# Patient Record
Sex: Male | Born: 1965 | Hispanic: No | Marital: Single | Smoking: Never smoker
Health system: Southern US, Community
[De-identification: ages and names within clinical notes are randomized; demographics above are authoritative.]

---

## 1999-02-18 ENCOUNTER — Emergency Department (HOSPITAL_COMMUNITY): Admission: EM | Admit: 1999-02-18 | Discharge: 1999-02-18 | Payer: Self-pay | Admitting: *Deleted

## 1999-02-18 ENCOUNTER — Encounter: Payer: Self-pay | Admitting: Emergency Medicine

## 2019-08-11 ENCOUNTER — Emergency Department (HOSPITAL_COMMUNITY): Payer: Self-pay

## 2019-08-11 ENCOUNTER — Other Ambulatory Visit: Payer: Self-pay

## 2019-08-11 ENCOUNTER — Encounter (HOSPITAL_COMMUNITY): Payer: Self-pay

## 2019-08-11 ENCOUNTER — Emergency Department (HOSPITAL_COMMUNITY)
Admission: EM | Admit: 2019-08-11 | Discharge: 2019-08-12 | Disposition: A | Discharge status: Home / Self Care | Payer: Self-pay | Attending: Emergency Medicine | Admitting: Emergency Medicine

## 2019-08-11 DIAGNOSIS — Z5321 Procedure and treatment not carried out due to patient leaving prior to being seen by health care provider: Secondary | ICD-10-CM | POA: Insufficient documentation

## 2019-08-11 DIAGNOSIS — R0789 Other chest pain: Secondary | ICD-10-CM | POA: Insufficient documentation

## 2019-08-11 LAB — TROPONIN I (HIGH SENSITIVITY)
Troponin I (High Sensitivity): 4 ng/L (ref <18)
Troponin I (High Sensitivity): 5 ng/L (ref <18)

## 2019-08-11 LAB — BASIC METABOLIC PANEL
Anion gap: 12 (ref 5–15)
BUN: 16 mg/dL (ref 6–20)
CO2: 24 mmol/L (ref 22–32)
Calcium: 9 mg/dL (ref 8.9–10.3)
Chloride: 104 mmol/L (ref 98–111)
Creatinine, Ser: 0.85 mg/dL (ref 0.61–1.24)
GFR calc Af Amer: >60 mL/min (ref >60)
GFR calc non Af Amer: >60 mL/min (ref >60)
Glucose, Bld: 103 mg/dL — ABNORMAL HIGH (ref 70–99)
Potassium: 4 mmol/L (ref 3.5–5.1)
Sodium: 140 mmol/L (ref 135–145)

## 2019-08-11 LAB — CBC
HCT: 46.8 % (ref 39.0–52.0)
Hemoglobin: 15.6 g/dL (ref 13.0–17.0)
MCH: 29.5 pg (ref 26.0–34.0)
MCHC: 33.3 g/dL (ref 30.0–36.0)
MCV: 88.5 fL (ref 80.0–100.0)
Platelets: 209 10*3/uL (ref 150–400)
RBC: 5.29 MIL/uL (ref 4.22–5.81)
RDW: 13.2 % (ref 11.5–15.5)
WBC: 9.4 10*3/uL (ref 4.0–10.5)
nRBC: 0 % (ref 0.0–0.2)

## 2019-08-11 MED ORDER — SODIUM CHLORIDE 0.9% FLUSH: 3.0000 mL | Freq: Once | INTRAVENOUS | Status: DC

## 2019-08-11 NOTE — ED Triage Notes (Signed)
Pt arrives to ED w/ c/o chest pain. Pt denies sob, n/v. Pt reports 5/10 pain. Central located, non-radiating. Pt took 3 nitro w/ minimal relief. Pt denies cardiac hx.

## 2019-08-12 ENCOUNTER — Encounter (HOSPITAL_COMMUNITY): Payer: Self-pay

## 2019-08-12 ENCOUNTER — Other Ambulatory Visit: Payer: Self-pay

## 2019-08-12 ENCOUNTER — Emergency Department (HOSPITAL_COMMUNITY): Payer: HRSA Program

## 2019-08-12 ENCOUNTER — Observation Stay (HOSPITAL_COMMUNITY)
Admission: EM | Admit: 2019-08-12 | Discharge: 2019-08-12 | Discharge status: Against Medical Advice | Payer: HRSA Program | Attending: Internal Medicine | Admitting: Internal Medicine

## 2019-08-12 DIAGNOSIS — E876 Hypokalemia: Secondary | ICD-10-CM | POA: Insufficient documentation

## 2019-08-12 DIAGNOSIS — R0789 Other chest pain: Secondary | ICD-10-CM | POA: Diagnosis not present

## 2019-08-12 DIAGNOSIS — U071 COVID-19: Secondary | ICD-10-CM | POA: Diagnosis not present

## 2019-08-12 DIAGNOSIS — R079 Chest pain, unspecified: Secondary | ICD-10-CM | POA: Insufficient documentation

## 2019-08-12 DIAGNOSIS — R002 Palpitations: Secondary | ICD-10-CM | POA: Diagnosis present

## 2019-08-12 LAB — CBC
HCT: 48.1 % (ref 39.0–52.0)
Hemoglobin: 15.8 g/dL (ref 13.0–17.0)
MCH: 29 pg (ref 26.0–34.0)
MCHC: 32.8 g/dL (ref 30.0–36.0)
MCV: 88.3 fL (ref 80.0–100.0)
Platelets: 189 10*3/uL (ref 150–400)
RBC: 5.45 MIL/uL (ref 4.22–5.81)
RDW: 13.2 % (ref 11.5–15.5)
WBC: 7.2 10*3/uL (ref 4.0–10.5)
nRBC: 0 % (ref 0.0–0.2)

## 2019-08-12 LAB — TROPONIN I (HIGH SENSITIVITY)
Troponin I (High Sensitivity): 2 ng/L (ref <18)
Troponin I (High Sensitivity): 3 ng/L (ref <18)

## 2019-08-12 LAB — BASIC METABOLIC PANEL
Anion gap: 9 (ref 5–15)
BUN: 16 mg/dL (ref 6–20)
CO2: 24 mmol/L (ref 22–32)
Calcium: 8.7 mg/dL — ABNORMAL LOW (ref 8.9–10.3)
Chloride: 103 mmol/L (ref 98–111)
Creatinine, Ser: 0.77 mg/dL (ref 0.61–1.24)
GFR calc Af Amer: >60 mL/min (ref >60)
GFR calc non Af Amer: >60 mL/min (ref >60)
Glucose, Bld: 181 mg/dL — ABNORMAL HIGH (ref 70–99)
Potassium: 3.7 mmol/L (ref 3.5–5.1)
Sodium: 136 mmol/L (ref 135–145)

## 2019-08-12 LAB — SARS CORONAVIRUS 2 BY RT PCR (HOSPITAL ORDER, PERFORMED IN ~~LOC~~ HOSPITAL LAB): SARS Coronavirus 2: POSITIVE — AB

## 2019-08-12 MED ORDER — SODIUM CHLORIDE 0.9% FLUSH: 3.0000 mL | Freq: Once | INTRAVENOUS | Status: DC

## 2019-08-12 NOTE — ED Triage Notes (Addendum)
Pt presents with c/o chest pain that started 4 days ago. Pt also reporting some pain in his head that started 4 days ago as well. Pt reports he has some nitro but that has not been helping the pain. Pt went to [facility] for same yesterday but the wait was too long so he left before seeing a doctor.

## 2019-08-12 NOTE — Progress Notes (Signed)
Progress note  Notified by bedside RN that patient is requesting to leave AMA. Asked RN to enter room with electronic translator to verify understanding of decision to leave AMA while this provider was on the phone. Patient stated he understood he was being admitted under observation due to complaints of chest pain for close observation overnight and stated he still wanted to leave as he no 'felt fine". I explained the risk of leaving AMA including but not limited to the risk of death and patient continued to express the desire to leave AMA. RN was at bedside during call and aware of patients decision.   Delfin Gant, NP-C Contact / Pager information can be found on Amion  08/12/2019, 10:45 PM

## 2019-08-12 NOTE — Progress Notes (Signed)
Progress note  Notified by ED charge nurse that patients COVID screening on admission resulted positive. Patient is currently asymptomatic. Closely monitor, COVID order set placed.   Delfin Gant, NP-C Contact / Pager information can be found on Amion  08/12/2019, 7:18 PM

## 2019-08-12 NOTE — H&P (Addendum)
History and Physical    Jafet Wissing ZOX:[id number] DOB: 11/29/1965 DOA: 08/12/2019  PCP: Patient, No Pcp Per   Patient coming from:  Home   Chief Complaint: Palpitations   HPI: Banjamin Stovall is a 54 y.o. male with no significant past medical history who presents with palpitations.  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  He denies any PND, orthopnea or palpitations.  He is able to do his physical work without any impairments.  Denies any tobacco, alcohol or drug abuse.  Due to his persistent symptoms he was seen at a local urgent clinic, he had nitroglycerin prescribed.   ED Course: Initial testing for acute coronary syndrome have been negative.  Patient has been referred for further cardiac work-up.  Review of Systems:  1. General: No fevers, no chills, no weight gain or weight loss 2. ENT: No runny nose or sore throat, no hearing disturbances 3. Pulmonary: No dyspnea, cough, wheezing, or hemoptysis 4. Cardiovascular: No angina, claudication, lower extremity edema, pnd or orthopnea/ positive for palpitations as mentioned in HPI.  5. Gastrointestinal: No nausea or vomiting, no diarrhea or constipation 6. Hematology: No easy bruisability or frequent infections 7. Urology: No dysuria, hematuria or increased urinary frequency 8. Dermatology: No rashes. 9. Neurology: No seizures or paresthesias 10. Musculoskeletal: No joint pain or deformities  History reviewed. No pertinent past medical history.  History reviewed. No pertinent surgical history.   reports that he has never smoked. He has never used smokeless tobacco. He reports that he does not drink alcohol or use drugs.  No Known Allergies  History reviewed. No pertinent family history.  Negative family  history for heart disease, diabetes or cancer.   Prior to Admission medications   Not on File    Physical Exam: Vitals:   08/12/19 1047 08/12/19 1421  BP: (!) 142/78 136/85  Pulse: 63 (!) 57  Resp: 18 16  Temp: 98.3 F (36.8 C) 98.2 F (36.8 C)  TempSrc: Oral Oral  SpO2: 94% 97%    Vitals:   08/12/19 1047 08/12/19 1421  BP: (!) 142/78 136/85  Pulse: 63 (!) 57  Resp: 18 16  Temp: 98.3 F (36.8 C) 98.2 F (36.8 C)  TempSrc: Oral Oral  SpO2: 94% 97%   General: Not in pain or dyspnea  Neurology: Awake and alert, non focal Head and Neck. Head normocephalic. Neck supple with no adenopathy or thyromegaly.   E ENT: no pallor, no icterus, oral mucosa moist Cardiovascular: No JVD. S1-S2 present, rhythmic, no gallops, rubs, or murmurs. No lower extremity edema. Pulmonary: positive breath sounds bilaterally, adequate air movement, no wheezing, rhonchi or rales. Gastrointestinal. Abdomen with  no organomegaly, non tender, no rebound or guarding Skin. No rashes Musculoskeletal: no joint deformities    Labs on Admission: I have personally reviewed following labs and imaging studies  CBC: Recent Labs  Lab 08/11/19 1938 08/12/19 1148  WBC 9.4 7.2  HGB 15.6 15.8  HCT 46.8 48.1  MCV 88.5 88.3  PLT 209 189   Basic Metabolic Panel: Recent Labs  Lab 08/11/19 1938 08/12/19 1148  NA 140 136  K 4.0 3.7  CL 104 103  CO2 24 24  GLUCOSE 103* 181*  BUN 16 16  CREATININE 0.85 0.77  CALCIUM 9.0 8.7*   GFR: CrCl cannot be calculated (Unknown ideal weight.). Liver Function Tests:  No results for input(s): AST, ALT, ALKPHOS, BILITOT, PROT, ALBUMIN in the last 168 hours. No results for input(s): LIPASE, AMYLASE in the last 168 hours. No results for input(s): AMMONIA in the last 168 hours. Coagulation Profile: No results for input(s): INR, PROTIME in the last 168 hours. Cardiac Enzymes: No results for input(s): CKTOTAL, CKMB, CKMBINDEX, TROPONINI in the last 168 hours. BNP  (last 3 results) No results for input(s): PROBNP in the last 8760 hours. HbA1C: No results for input(s): HGBA1C in the last 72 hours. CBG: No results for input(s): GLUCAP in the last 168 hours. Lipid Profile: No results for input(s): CHOL, HDL, LDLCALC, TRIG, CHOLHDL, LDLDIRECT in the last 72 hours. Thyroid Function Tests: No results for input(s): TSH, T4TOTAL, FREET4, T3FREE, THYROIDAB in the last 72 hours. Anemia Panel: No results for input(s): VITAMINB12, FOLATE, FERRITIN, TIBC, IRON, RETICCTPCT in the last 72 hours. Urine analysis: No results found for: COLORURINE, APPEARANCEUR, LABSPEC, PHURINE, GLUCOSEU, HGBUR, BILIRUBINUR, KETONESUR, PROTEINUR, UROBILINOGEN, NITRITE, LEUKOCYTESUR  Radiological Exams on Admission: DG Chest 2 View  Result Date: 08/12/2019 CLINICAL DATA:  Chest pain starting 4 days ago. EXAM: CHEST - 2 VIEW COMPARISON:  Chest x-ray dated 08/11/2019. FINDINGS: Heart size and mediastinal contours are within normal limits. Lungs are clear. No pleural effusion or pneumothorax is seen. Osseous structures about the chest are unremarkable. IMPRESSION: No active cardiopulmonary disease. No evidence of pneumonia or pulmonary edema. Electronically Signed   By: Bary Richard M.D.   On: 08/12/2019 11:17   DG Chest 2 View  Result Date: 08/11/2019 CLINICAL DATA:  Chest pain EXAM: CHEST - 2 VIEW COMPARISON:  None. FINDINGS: The heart size and mediastinal contours are within normal limits. Both lungs are clear. The visualized skeletal structures are unremarkable. IMPRESSION: No active cardiopulmonary disease. Electronically Signed   By: Jonna Clark M.D.   On: 08/11/2019 20:08    EKG: Independently reviewed.  60 bpm, normal axis, normal intervals, sinus rhythm, small q in lead III and aVF, no ST segment or T wave changes.  Assessment/Plan Active Problems:   Palpitations   54 year old male with no significant past medical history who presents with palpitations for the last 4 days.   His symptoms seem not to be exertion related, no signs of heart failure or angina.  On his initial physical examination his temperature is 98.2, blood pressure 136/85, heart rate 57-63, respiratory rate 16, oxygen saturation 97% on room air, his lungs are clear to auscultation bilaterally, heart S1-S2 present rhythmic, soft abdomen, no lower extremity edema. Sodium 136, potassium 3.7, chloride 103, bicarb 24, glucose 181, BUN 16, creatinine 0.77, troponin I 2, white count 7.2, hemoglobin 15.8, hematocrit 48.2, platelets 189.  Chest radiograph with no infiltrates, prominent right pulmonary artery.  Patient will be admitted to the hospital working diagnosis of palpitations, rule out cardiac arrhythmia.  1.  Palpitations. Patient will be admitted to the telemetry ward, at this point he has ruled out for acute coronary syndrome.  If no further arrhythmias on the monitor patient will be candidate for outpatient cardiac monitoring.  Further work-up with echocardiography to rule out any structural heart disease.  2.  Hypokalemia.  Will give 40 mEq of potassium chloride, check magnesium in a.m.  Target K at 4 and magnesium 2.   Status is: Observation  The patient remains OBS appropriate and will d/c before 2 midnights.  Dispo: The patient is from: Home              Anticipated d/c is  to: Home              Anticipated d/c date is: 1 day              Patient currently is not medically stable to d/c.   DVT prophylaxis: Enoxaparin   Code Status:   full  Family Communication:  No family at the bedside     Consults called:  None   Admission status:  Observation.    Georgios Kina Gerome Apley MD [facility]   08/12/2019, 4:47 PM

## 2019-08-12 NOTE — ED Provider Notes (Signed)
[facility] DEPT Provider Note   CSN: [id number] Arrival date & time: 08/12/19  1038     History Chief Complaint  Patient presents with  . Chest Pain  . Headache    Jeffrey Lamb is a 54 y.o. male.  HPI HPI Comments: Jeffrey Lamb is a 54 y.o. male with no known medical history who presents to the Emergency Department complaining of chest pain.  Patient states about 4 days ago he started experiencing spontaneous chest pain that lasts about 2 to 3 minutes with each episode.  During these episodes he notes associated palpitations, mild headache, mild abdominal pain.  These occur about 2-3 times a day.  No known modifying factors.  He was initially taking Tylenol which was providing mild relief.  [place] [facility] [facility]" yesterday and was given nitroglycerin.  He states he took this 1 time yesterday with relief.  His most recent occurrence was this morning.  He notes some palpable pain along the left chest wall.  He does not smoke, drink alcohol, or do drugs.  He denies fevers, chills, URI symptoms, nausea, vomiting, diarrhea, constipation, syncope.    History reviewed. No pertinent past medical history.  There are no problems to display for this patient.   History reviewed. No pertinent surgical history.     History reviewed. No pertinent family history.  Social History   Tobacco Use  . Smoking status: Never Smoker  . Smokeless tobacco: Never Used  Substance Use Topics  . Alcohol use: Never  . Drug use: Never    Home Medications Prior to Admission medications   Not on File    Allergies    Patient has no known allergies.  Review of Systems   Review of Systems  All other systems reviewed and are negative. Ten systems reviewed and are negative for acute change, except as noted in the HPI.    Physical Exam Updated Vital Signs BP 136/85 (BP Location: Right Arm)   Pulse (!) 57   Temp 98.2 F (36.8 C)  (Oral)   Resp 16   SpO2 97%   Physical Exam Vitals and nursing note reviewed.  Constitutional:      General: He is not in acute distress.    Appearance: He is well-developed. He is obese. He is not ill-appearing, toxic-appearing or diaphoretic.  HENT:     Head: Normocephalic and atraumatic.  Eyes:     Extraocular Movements: Extraocular movements intact.     Pupils: Pupils are equal, round, and reactive to light.  Cardiovascular:     Rate and Rhythm: Normal rate and regular rhythm.     Pulses:          Radial pulses are 2+ on the right side and 2+ on the left side.       Dorsalis pedis pulses are 2+ on the right side and 2+ on the left side.     Heart sounds: Normal heart sounds. Heart sounds not distant. No murmur. No systolic murmur. No diastolic murmur. No S3 or S4 sounds.   Pulmonary:     Effort: Pulmonary effort is normal. No tachypnea, accessory muscle usage or respiratory distress.     Breath sounds: Normal breath sounds. No stridor. No decreased breath sounds, wheezing, rhonchi or rales.  Chest:     Chest wall: Tenderness present. No deformity or crepitus.     Comments: Mild TTP noted along the left sternal border.  No crepitus.  No visible signs of trauma. Abdominal:  General: Bowel sounds are normal.     Palpations: Abdomen is soft.     Tenderness: There is no abdominal tenderness.  Musculoskeletal:        General: Normal range of motion.     Cervical back: Normal range of motion and neck supple.     Right lower leg: No tenderness. No edema.     Left lower leg: No tenderness. No edema.     Comments: No lower extremity edema noted.  No calf pain.  Skin:    General: Skin is warm and dry.  Neurological:     General: No focal deficit present.     Mental Status: He is alert and oriented to person, place, and time.  Psychiatric:        Mood and Affect: Mood normal.        Behavior: Behavior normal.    ED Results / Procedures / Treatments   Labs (all labs ordered  are listed, but only abnormal results are displayed) Labs Reviewed  BASIC METABOLIC PANEL - Abnormal; Notable for the following components:      Result Value   Glucose, Bld 181 (*)    Calcium 8.7 (*)    All other components within normal limits  SARS CORONAVIRUS 2 BY RT PCR (HOSPITAL ORDER, PERFORMED IN [facility])  CBC  TROPONIN I (HIGH SENSITIVITY)  TROPONIN I (HIGH SENSITIVITY)   EKG None  Radiology DG Chest 2 View  Result Date: 08/12/2019 CLINICAL DATA:  Chest pain starting 4 days ago. EXAM: CHEST - 2 VIEW COMPARISON:  Chest x-ray dated 08/11/2019. FINDINGS: Heart size and mediastinal contours are within normal limits. Lungs are clear. No pleural effusion or pneumothorax is seen. Osseous structures about the chest are unremarkable. IMPRESSION: No active cardiopulmonary disease. No evidence of pneumonia or pulmonary edema. Electronically Signed   By: Bary Richard M.D.   On: 08/12/2019 11:17   DG Chest 2 View  Result Date: 08/11/2019 CLINICAL DATA:  Chest pain EXAM: CHEST - 2 VIEW COMPARISON:  None. FINDINGS: The heart size and mediastinal contours are within normal limits. Both lungs are clear. The visualized skeletal structures are unremarkable. IMPRESSION: No active cardiopulmonary disease. Electronically Signed   By: Jonna Clark M.D.   On: 08/11/2019 20:08   Procedures Procedures   Medications Ordered in ED Medications  sodium chloride flush (NS) 0.9 % injection 3 mL (has no administration in time range)    ED Course  I have reviewed the triage vital signs and the nursing notes.  Pertinent labs & imaging results that were available during my care of the patient were reviewed by me and considered in my medical decision making (see chart for details).    MDM Rules/Calculators/A&P                      Patient is a pleasant 54 year old male that presents with 5 days of intermittent chest pain.  During these episodes he experiences sharp left sternal border  chest pain that occurs for about 2 to 3 minutes and spontaneously alleviates.  During these episodes he feels as if he experiences palpitations, headaches, fatigue, lightheadedness.  He was seen in a "[place]" yesterday and was given nitroglycerin.  He states that he took NTG x 3 during one of his chest pain episodes with relief. His troponin is negative today.  Chest x-ray and ECG are reassuring.  No other significant findings in basic labs.  I discussed this patient with  my attending physician Dr. Lorre Nick.  We believe it is a reasonable to discuss patient with the admission team for observation for the next 24 hours.  I discussed with the hospitalist team for admission.  They will accept the patient for observation.  COVID-19 test ordered.   Final Clinical Impression(s) / ED Diagnoses Final diagnoses:  Atypical chest pain  Palpitations    Rx / DC Orders ED Discharge Orders    None       Placido Sou, PA-C 08/12/19 1751    Lorre Nick, MD 08/13/19 1211

## 2019-08-12 NOTE — ED Notes (Signed)
MD was notified and spoke with pt via interpreter. Pt was aware that he was leaving against medical advice. He is aware that his chest pain could result in death if it is a heart attack.

## 2020-01-09 ENCOUNTER — Emergency Department (HOSPITAL_COMMUNITY)
Admission: EM | Admit: 2020-01-09 | Discharge: 2020-01-10 | Disposition: A | Discharge status: Home / Self Care | Payer: Self-pay | Attending: Emergency Medicine | Admitting: Emergency Medicine

## 2020-01-09 ENCOUNTER — Ambulatory Visit (HOSPITAL_COMMUNITY)
Admission: EM | Admit: 2020-01-09 | Discharge: 2020-01-09 | Disposition: A | Discharge status: Home / Self Care | Payer: Self-pay | Attending: Emergency Medicine | Admitting: Emergency Medicine

## 2020-01-09 ENCOUNTER — Other Ambulatory Visit: Payer: Self-pay

## 2020-01-09 DIAGNOSIS — K219 Gastro-esophageal reflux disease without esophagitis: Secondary | ICD-10-CM | POA: Insufficient documentation

## 2020-01-09 DIAGNOSIS — R202 Paresthesia of skin: Secondary | ICD-10-CM

## 2020-01-09 DIAGNOSIS — R0789 Other chest pain: Secondary | ICD-10-CM | POA: Insufficient documentation

## 2020-01-09 DIAGNOSIS — R109 Unspecified abdominal pain: Secondary | ICD-10-CM

## 2020-01-09 LAB — POCT URINALYSIS DIPSTICK, ED / UC
Bilirubin Urine: NEGATIVE
Glucose, UA: NEGATIVE mg/dL
Hgb urine dipstick: NEGATIVE
Leukocytes,Ua: NEGATIVE
Nitrite: NEGATIVE
Protein, ur: NEGATIVE mg/dL
Specific Gravity, Urine: 1.015 (ref 1.005–1.030)
Urobilinogen, UA: 0.2 mg/dL (ref 0.0–1.0)
pH: 5 (ref 5.0–8.0)

## 2020-01-09 LAB — CBG MONITORING, ED: Glucose-Capillary: 94 mg/dL (ref 70–99)

## 2020-01-09 MED ORDER — LIDOCAINE VISCOUS HCL 2 % MT SOLN
15.0000 mL | Freq: Once | OROMUCOSAL | Status: AC
  Administered 2020-01-09: 15 mL via ORAL

## 2020-01-09 MED ORDER — FAMOTIDINE 20 MG PO TABS: 20.0000 mg | ORAL_TABLET | Freq: Two times a day (BID) | ORAL | 0 refills | Status: DC

## 2020-01-09 MED ORDER — OMEPRAZOLE 20 MG PO CPDR: 20.0000 mg | DELAYED_RELEASE_CAPSULE | Freq: Two times a day (BID) | ORAL | 0 refills | Status: DC

## 2020-01-09 MED ORDER — ALUM & MAG HYDROXIDE-SIMETH 200-200-20 MG/5ML PO SUSP
ORAL | Status: AC
  Filled 2020-01-09: qty 30

## 2020-01-09 MED ORDER — OMEPRAZOLE 20 MG PO CPDR: 20.0000 mg | DELAYED_RELEASE_CAPSULE | Freq: Two times a day (BID) | ORAL | 0 refills | Status: AC

## 2020-01-09 MED ORDER — LIDOCAINE VISCOUS HCL 2 % MT SOLN
OROMUCOSAL | Status: AC
  Filled 2020-01-09: qty 15

## 2020-01-09 MED ORDER — NAPROXEN 500 MG PO TABS: 500.0000 mg | ORAL_TABLET | Freq: Two times a day (BID) | ORAL | 0 refills | Status: AC

## 2020-01-09 MED ORDER — ALUM & MAG HYDROXIDE-SIMETH 200-200-20 MG/5ML PO SUSP
30.0000 mL | Freq: Once | ORAL | Status: AC
  Administered 2020-01-09: 30 mL via ORAL

## 2020-01-09 MED ORDER — FAMOTIDINE 20 MG PO TABS: 20.0000 mg | ORAL_TABLET | Freq: Two times a day (BID) | ORAL | 0 refills | Status: AC

## 2020-01-09 NOTE — Discharge Instructions (Addendum)
Omeprazole 2 veces cada dia para prevenir el acido [place] pepcid 2 veces cada dia si necesita para ardor en pecho/garganta [place] naprosyn si necesita para dolor del cabeza, piernas, el brazo y espalda Regrese si no mejoran o Psychologist, prison and probation services

## 2020-01-09 NOTE — ED Provider Notes (Signed)
[facility]    CSN: [id number] Arrival date & time: 01/09/20  1726      History   Chief Complaint Chief Complaint  Patient presents with  . Abdominal Pain    HPI Jeffrey Lamb is a 54 y.o. male presenting today for evaluation of abdominal discomfort and burning in chest/throat.  Patient reports over the past 3 days he has had a burning sensation in his stomach chest and radiates into the throat.  Symptoms slightly improved today.  He does report he has noticed symptoms worsen after eating, ate salad today with dressing and still felt symptoms increase with this.  Also reports he often is eating Chili[employer] and peppers spicy foods.  He denies any associated nausea or vomiting.  He denies changes in bowel movements.  He also reports a burning sensation that comes and goes that he feels in his back.  Reports tingling in his legs.  Denies weakness in legs.  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  Occasionally has pain in his upper back, but denies pain in lower back.  Denies history of tobacco use.  Denies history of hypertension, diabetes.  He has also noticed a smell with urination, denies urinary frequency.     HPI  No past medical history on file.  Patient Active Problem List   Diagnosis Date Noted  . Chest pain 08/12/2019  . Palpitations 08/12/2019    No past surgical history on file.     Home Medications    Prior to Admission medications   Medication Sig Start Date End Date Taking? Authorizing Provider  acetaminophen (TYLENOL) 325 MG tablet Take 650 mg by mouth every 6 (six) hours as needed for mild pain or moderate pain.    [provider]  famotidine (PEPCID) 20 MG tablet Take 1 tablet (20 mg total) by mouth 2 (two) times daily. 01/09/20   Cenia Zaragosa C, PA-C  naproxen (NAPROSYN) 500 MG tablet Take 1 tablet (500 mg total) by mouth 2 (two) times daily. 01/09/20   Loanne Emery C, PA-C  nitroGLYCERIN  (NITROSTAT) 0.4 MG SL tablet Place 0.4 mg under the tongue See admin instructions. Place 1 tablet (0.4mg ) under the tongue every 5 minutes x3 as needed to relieve chest pain    [provider]  omeprazole (PRILOSEC) 20 MG capsule Take 1 capsule (20 mg total) by mouth 2 (two) times daily before a meal. 01/09/20   Cynithia Hakimi, [place] C, PA-C    Family History No family history on file.  Social History Social History   Tobacco Use  . Smoking status: Never Smoker  . Smokeless tobacco: Never Used  Substance Use Topics  . Alcohol use: Never  . Drug use: Never     Allergies   Patient has no known allergies.   Review of Systems Review of Systems  Constitutional: Negative for activity change, appetite change, chills, fatigue and fever.  HENT: Negative for congestion, ear pain, rhinorrhea, sinus pressure, sore throat and trouble swallowing.   Eyes: Negative for discharge and redness.  Respiratory: Negative for cough, chest tightness and shortness of breath.   Cardiovascular: Negative for chest pain.  Gastrointestinal: Positive for abdominal pain. Negative for diarrhea, nausea and vomiting.  Musculoskeletal: Negative for myalgias.  Skin: Negative for rash.  Neurological: Negative for dizziness, light-headedness and headaches.     Physical Exam Triage Vital Signs ED Triage Vitals  Enc Vitals Group     BP 01/09/20 1831 Marland Kitchen)  141/117     Pulse Rate 01/09/20 1831 67     Resp 01/09/20 1831 18     Temp 01/09/20 1831 98.2 F (36.8 C)     Temp Source 01/09/20 1831 Oral     SpO2 01/09/20 1831 98 %     Weight --      Height --      Head Circumference --      Peak Flow --      Pain Score 01/09/20 1834 6     Pain Loc --      Pain Edu? --      Excl. in GC? --    No data found.  Updated Vital Signs BP (!) 141/117 (BP Location: Right Arm)   Pulse 67   Temp 98.2 F (36.8 C) (Oral)   Resp 18   SpO2 98%   Visual Acuity Right Eye Distance:   Left Eye Distance:   Bilateral  Distance:    Right Eye Near:   Left Eye Near:    Bilateral Near:     Physical Exam Vitals and nursing note reviewed.  Constitutional:      Appearance: He is well-developed.     Comments: No acute distress  HENT:     Head: Normocephalic and atraumatic.     Nose: Nose normal.     Mouth/Throat:     Comments: Oral mucosa pink and moist, no tonsillar enlargement or exudate. Posterior pharynx patent and nonerythematous, no uvula deviation or swelling. Normal phonation.  Eyes:     Conjunctiva/sclera: Conjunctivae normal.  Cardiovascular:     Rate and Rhythm: Normal rate and regular rhythm.  Pulmonary:     Effort: Pulmonary effort is normal. No respiratory distress.     Comments: Breathing comfortably at rest, CTABL, no wheezing, rales or other adventitious sounds auscultated   Minimally reproducible pain to left anterior chest Abdominal:     General: There is no distension.  Musculoskeletal:        General: Normal range of motion.     Cervical back: Neck supple.  Skin:    General: Skin is warm and dry.  Neurological:     General: No focal deficit present.     Mental Status: He is alert and oriented to person, place, and time. Mental status is at baseline.     Cranial Nerves: No cranial nerve deficit.     Motor: No weakness.     Gait: Gait normal.      UC Treatments / Results  Labs (all labs ordered are listed, but only abnormal results are displayed) Labs Reviewed  POCT URINALYSIS DIPSTICK, ED / UC - Abnormal; Notable for the following components:      Result Value   Ketones, ur TRACE (*)    All other components within normal limits  CBG MONITORING, ED    EKG   Radiology DG Chest 2 View  Result Date: 01/10/2020 CLINICAL DATA:  Chest pain EXAM: CHEST - 2 VIEW COMPARISON:  08/12/2019 FINDINGS: The heart size and mediastinal contours are within normal limits. Both lungs are clear. The visualized skeletal structures are unremarkable. IMPRESSION: No active  cardiopulmonary disease. Electronically Signed   By: Deatra Robinson M.D.   On: 01/10/2020 00:42    Procedures Procedures (including critical care time)  Medications Ordered in UC Medications  alum & mag hydroxide-simeth (MAALOX/MYLANTA) 200-200-20 MG/5ML suspension 30 mL (30 mLs Oral Given 01/09/20 1913)    And  lidocaine (XYLOCAINE) 2 % viscous mouth solution 15  mL (15 mLs Oral Given 01/09/20 1913)    Initial Impression / Assessment and Plan / UC Course  I have reviewed the triage vital signs and the nursing notes.  Pertinent labs & imaging results that were available during my care of the patient were reviewed by me and considered in my medical decision making (see chart for details).     EKG normal sinus bradycardia at 56, no acute signs of ischemia or infarction.  GI cocktail provided with improvement in discomfort in chest and throat.  Suspect likely GERD as cause of symptoms given correlation to eating as well.  Recommend going through course of omeprazole and Pepcid to supplement.  Patient reports other vague complaints that are intermittent such as tingling in legs and burning sensation in back, unclear cause dizziness, neurologically intact, strength intact.  UA unremarkable without glucose, blood sugar 94.  Recommended to use anti-inflammatories as needed for discomforts.  If any symptoms not improving or worsening to follow-up for reevaluation. Final Clinical Impressions(s) / UC Diagnoses   Final diagnoses:  Gastroesophageal reflux disease, unspecified whether esophagitis present  Paresthesia of both legs     Discharge Instructions     Omeprazole 2 veces cada dia para prevenir el acido [place] pepcid 2 veces cada dia si necesita para ardor en pecho/garganta [place] naprosyn si necesita para dolor del cabeza, piernas, el brazo y espalda Reg[place] si no mejoran o empeoran    ED Prescriptions    Medication Sig Dispense Auth. Provider   omeprazole (PRILOSEC) 20 MG capsule   (Status: Discontinued) Take 1 capsule (20 mg total) by mouth 2 (two) times daily before a meal. 30 capsule Ondra Deboard C, PA-C   famotidine (PEPCID) 20 MG tablet  (Status: Discontinued) Take 1 tablet (20 mg total) by mouth 2 (two) times daily. 30 tablet Lynton Crescenzo C, PA-C   famotidine (PEPCID) 20 MG tablet Take 1 tablet (20 mg total) by mouth 2 (two) times daily. 30 tablet Dayanira Giovannetti C, PA-C   omeprazole (PRILOSEC) 20 MG capsule Take 1 capsule (20 mg total) by mouth 2 (two) times daily before a meal. 30 capsule Reubin Bushnell C, PA-C   naproxen (NAPROSYN) 500 MG tablet Take 1 tablet (500 mg total) by mouth 2 (two) times daily. 30 tablet Muriah Harsha, [place] C, PA-C     PDMP not reviewed this encounter.   Lew Dawes, PA-C 01/10/20 1203

## 2020-01-09 NOTE — ED Triage Notes (Signed)
Pt present abdominal pain with burning in the stomach. Symptom started 3 days ago. Pt states that his body feels hot inside.

## 2020-01-10 ENCOUNTER — Other Ambulatory Visit: Payer: Self-pay

## 2020-01-10 ENCOUNTER — Emergency Department (HOSPITAL_COMMUNITY): Payer: Self-pay

## 2020-01-10 LAB — BASIC METABOLIC PANEL
Anion gap: 10 (ref 5–15)
BUN: 12 mg/dL (ref 6–20)
CO2: 24 mmol/L (ref 22–32)
Calcium: 9 mg/dL (ref 8.9–10.3)
Chloride: 104 mmol/L (ref 98–111)
Creatinine, Ser: 0.92 mg/dL (ref 0.61–1.24)
GFR, Estimated: >60 mL/min (ref >60)
Glucose, Bld: 172 mg/dL — ABNORMAL HIGH (ref 70–99)
Potassium: 3.7 mmol/L (ref 3.5–5.1)
Sodium: 138 mmol/L (ref 135–145)

## 2020-01-10 LAB — CBC
HCT: 44.6 % (ref 39.0–52.0)
Hemoglobin: 14.7 g/dL (ref 13.0–17.0)
MCH: 28.9 pg (ref 26.0–34.0)
MCHC: 33 g/dL (ref 30.0–36.0)
MCV: 87.6 fL (ref 80.0–100.0)
Platelets: 181 10*3/uL (ref 150–400)
RBC: 5.09 MIL/uL (ref 4.22–5.81)
RDW: 13 % (ref 11.5–15.5)
WBC: 9.4 10*3/uL (ref 4.0–10.5)
nRBC: 0 % (ref 0.0–0.2)

## 2020-01-10 LAB — TROPONIN I (HIGH SENSITIVITY)
Troponin I (High Sensitivity): 5 ng/L (ref <18)
Troponin I (High Sensitivity): 5 ng/L (ref <18)

## 2020-01-10 MED ORDER — ALUM & MAG HYDROXIDE-SIMETH 200-200-20 MG/5ML PO SUSP
30.0000 mL | Freq: Once | ORAL | Status: AC
  Administered 2020-01-10: 30 mL via ORAL
  Filled 2020-01-10: qty 30

## 2020-01-10 MED ORDER — FAMOTIDINE 20 MG PO TABS
20.0000 mg | ORAL_TABLET | Freq: Once | ORAL | Status: AC
  Administered 2020-01-10: 20 mg via ORAL
  Filled 2020-01-10: qty 1

## 2020-01-10 MED ORDER — LIDOCAINE VISCOUS HCL 2 % MT SOLN
15.0000 mL | Freq: Once | OROMUCOSAL | Status: AC
  Administered 2020-01-10: 15 mL via ORAL
  Filled 2020-01-10: qty 15

## 2020-01-10 NOTE — Discharge Instructions (Signed)
All cardiac tests are normal. Pick up medication from pharmacy that was sent from urgent care earlier.  Take as directed. I would try to limit spicy/acidics foods, NSAIDs (motrin, aleve, ibuprofen, etc).  Tylenol is ok.   Stay upright for 30 mins after eating. Follow-up with your primary care doctor. Return here for any new/acute changes.

## 2020-01-10 NOTE — ED Provider Notes (Signed)
[facility] EMERGENCY DEPARTMENT Provider Note   CSN: [id number] Arrival date & time: 01/09/20  2308     History Chief Complaint  Patient presents with  . Chest Pain    Jeffrey Lamb is a 54 y.o. male.  The history is provided by the patient and medical records. The history is limited by a language barrier. A language interpreter was used.  Chest Pain   54 year old male presenting to the ED with chest pain.  Patient was seen earlier yesterday at urgent care for same and prescribed medications, however was not able to pick them up as the pharmacy was already closed by the time he got there.  States he has had symptoms for about 3 days now, midsternal in nature with some radiation into his throat and otherwise into the abdomen.  States it began after eating a salad with acidic dressing on it.  He has not had any nausea, vomiting, diaphoresis, dizziness, or feelings of syncope.  He has no known cardiac history.  He does not drink alcohol.  He is not a smoker.  He does tend to eat a lot of spicy food, lots of peppers, lemon, and lime for seasoning.  No past medical history on file.  Patient Active Problem List   Diagnosis Date Noted  . Chest pain 08/12/2019  . Palpitations 08/12/2019    No past surgical history on file.     No family history on file.  Social History   Tobacco Use  . Smoking status: Never Smoker  . Smokeless tobacco: Never Used  Substance Use Topics  . Alcohol use: Never  . Drug use: Never    Home Medications Prior to Admission medications   Medication Sig Start Date End Date Taking? Authorizing Provider  acetaminophen (TYLENOL) 325 MG tablet Take 650 mg by mouth every 6 (six) hours as needed for mild pain or moderate pain.    [provider]  famotidine (PEPCID) 20 MG tablet Take 1 tablet (20 mg total) by mouth 2 (two) times daily. 01/09/20   Wieters, Hallie C, PA-C  naproxen (NAPROSYN) 500 MG tablet Take 1 tablet  (500 mg total) by mouth 2 (two) times daily. 01/09/20   Wieters, Hallie C, PA-C  nitroGLYCERIN (NITROSTAT) 0.4 MG SL tablet Place 0.4 mg under the tongue See admin instructions. Place 1 tablet (0.4mg ) under the tongue every 5 minutes x3 as needed to relieve chest pain    [provider]  omeprazole (PRILOSEC) 20 MG capsule Take 1 capsule (20 mg total) by mouth 2 (two) times daily before a meal. 01/09/20   Wieters, Hallie C, PA-C    Allergies    Patient has no known allergies.  Review of Systems   Review of Systems  Cardiovascular: Positive for chest pain.  All other systems reviewed and are negative.   Physical Exam Updated Vital Signs BP 118/75 (BP Location: Right Arm)   Pulse (!) 52   Temp 98 F (36.7 C) (Oral)   Resp 16   SpO2 99%   Physical Exam Vitals and nursing note reviewed.  Constitutional:      Appearance: He is well-developed.  HENT:     Head: Normocephalic and atraumatic.  Eyes:     Conjunctiva/sclera: Conjunctivae normal.     Pupils: Pupils are equal, round, and reactive to light.  Cardiovascular:     Rate and Rhythm: Normal rate and regular rhythm.     Heart sounds: Normal heart sounds.  Pulmonary:  Effort: Pulmonary effort is normal.     Breath sounds: Normal breath sounds. No wheezing, rhonchi or rales.  Abdominal:     General: Bowel sounds are normal.     Palpations: Abdomen is soft.     Tenderness: There is no abdominal tenderness. There is no guarding or rebound.  Musculoskeletal:        General: Normal range of motion.     Cervical back: Normal range of motion.  Skin:    General: Skin is warm and dry.  Neurological:     Mental Status: He is alert and oriented to person, place, and time.     ED Results / Procedures / Treatments   Labs (all labs ordered are listed, but only abnormal results are displayed) Labs Reviewed  BASIC METABOLIC PANEL - Abnormal; Notable for the following components:      Result Value   Glucose, Bld 172  (*)    All other components within normal limits  CBC  TROPONIN I (HIGH SENSITIVITY)  TROPONIN I (HIGH SENSITIVITY)    EKG None  Radiology DG Chest 2 View  Result Date: 01/10/2020 CLINICAL DATA:  Chest pain EXAM: CHEST - 2 VIEW COMPARISON:  08/12/2019 FINDINGS: The heart size and mediastinal contours are within normal limits. Both lungs are clear. The visualized skeletal structures are unremarkable. IMPRESSION: No active cardiopulmonary disease. Electronically Signed   By: Deatra Robinson M.D.   On: 01/10/2020 00:42    Procedures Procedures (including critical care time)  Medications Ordered in ED Medications  famotidine (PEPCID) tablet 20 mg (20 mg Oral Given 01/10/20 0518)  alum & mag hydroxide-simeth (MAALOX/MYLANTA) 200-200-20 MG/5ML suspension 30 mL (30 mLs Oral Given 01/10/20 0518)    And  lidocaine (XYLOCAINE) 2 % viscous mouth solution 15 mL (15 mLs Oral Given 01/10/20 0518)    ED Course  I have reviewed the triage vital signs and the nursing notes.  Pertinent labs & imaging results that were available during my care of the patient were reviewed by me and considered in my medical decision making (see chart for details).    MDM Rules/Calculators/A&P  54 year old male presenting to the ED with chest pain for the past 3 days.  Symptoms began after eating a salad with acidic dressings.  States that feels like a burning sensation.  No shortness of breath, diaphoresis, nausea, vomiting, or feelings of syncope.  No prior cardiac history.  He is not a smoker.  EKG here is nonischemic.  Labs are reassuring including troponin x2.  Chest x-ray is clear.  He has not had any recent URI symptoms.  Suspect acid reflux.  He does report eating lots of peppers, lemon, and lime for seasonings.  He was given prescriptions for acid reflux at urgent care but unable to get them filled as pharmacy was closed.  Will treat here and have him start these medications as an outpatient.  We have discussed  diet modification including limiting spicy/acidic foods, avoiding NSAIDs and EtOH, staying upright for 30 mins after eating, etc.  He may follow-up with PCP.  Return here for any new/acute changes.  Final Clinical Impression(s) / ED Diagnoses Final diagnoses:  Atypical chest pain  Gastroesophageal reflux disease without esophagitis    Rx / DC Orders ED Discharge Orders    None       Garlon Hatchet, PA-C 01/10/20 8527    Marily Memos, MD 01/10/20 [phone]

## 2020-01-10 NOTE — ED Triage Notes (Signed)
Pt presents to ED POV. Pt c/o L CP and burning in abd. Pt reports that pain began x3d. Pt reports that pain worsened today after eating lunch. Pt seen earlier at [facility] and was unable to fill prescriptions.

## 2020-03-04 ENCOUNTER — Other Ambulatory Visit: Payer: Self-pay

## 2020-03-04 ENCOUNTER — Emergency Department (HOSPITAL_COMMUNITY)
Admission: EM | Admit: 2020-03-04 | Discharge: 2020-03-05 | Disposition: A | Discharge status: Home / Self Care | Payer: Self-pay | Attending: Emergency Medicine | Admitting: Emergency Medicine

## 2020-03-04 ENCOUNTER — Encounter (HOSPITAL_COMMUNITY): Payer: Self-pay

## 2020-03-04 ENCOUNTER — Emergency Department (HOSPITAL_COMMUNITY): Payer: Self-pay

## 2020-03-04 DIAGNOSIS — R072 Precordial pain: Secondary | ICD-10-CM | POA: Insufficient documentation

## 2020-03-04 DIAGNOSIS — M542 Cervicalgia: Secondary | ICD-10-CM | POA: Insufficient documentation

## 2020-03-04 DIAGNOSIS — R001 Bradycardia, unspecified: Secondary | ICD-10-CM | POA: Insufficient documentation

## 2020-03-04 DIAGNOSIS — M25512 Pain in left shoulder: Secondary | ICD-10-CM | POA: Insufficient documentation

## 2020-03-04 DIAGNOSIS — R1084 Generalized abdominal pain: Secondary | ICD-10-CM | POA: Insufficient documentation

## 2020-03-04 DIAGNOSIS — M25511 Pain in right shoulder: Secondary | ICD-10-CM | POA: Insufficient documentation

## 2020-03-04 LAB — CBC
HCT: 44.6 % (ref 39.0–52.0)
Hemoglobin: 14.6 g/dL (ref 13.0–17.0)
MCH: 29 pg (ref 26.0–34.0)
MCHC: 32.7 g/dL (ref 30.0–36.0)
MCV: 88.5 fL (ref 80.0–100.0)
Platelets: 209 10*3/uL (ref 150–400)
RBC: 5.04 MIL/uL (ref 4.22–5.81)
RDW: 13.2 % (ref 11.5–15.5)
WBC: 11 10*3/uL — ABNORMAL HIGH (ref 4.0–10.5)
nRBC: 0 % (ref 0.0–0.2)

## 2020-03-04 LAB — BASIC METABOLIC PANEL
Anion gap: 11 (ref 5–15)
BUN: 18 mg/dL (ref 6–20)
CO2: 22 mmol/L (ref 22–32)
Calcium: 8.7 mg/dL — ABNORMAL LOW (ref 8.9–10.3)
Chloride: 107 mmol/L (ref 98–111)
Creatinine, Ser: 0.77 mg/dL (ref 0.61–1.24)
GFR, Estimated: >60 mL/min (ref >60)
Glucose, Bld: 102 mg/dL — ABNORMAL HIGH (ref 70–99)
Potassium: 3.8 mmol/L (ref 3.5–5.1)
Sodium: 140 mmol/L (ref 135–145)

## 2020-03-04 LAB — TROPONIN I (HIGH SENSITIVITY): Troponin I (High Sensitivity): 4 ng/L (ref <18)

## 2020-03-04 NOTE — ED Triage Notes (Signed)
Pt has CP that has been going on for the past few days, radiation to neck, denies n/v, seen the other day for the same.

## 2020-03-05 ENCOUNTER — Emergency Department (HOSPITAL_COMMUNITY): Payer: Self-pay

## 2020-03-05 LAB — LIPASE, BLOOD: Lipase: 28 U/L (ref 11–51)

## 2020-03-05 LAB — HEPATIC FUNCTION PANEL
ALT: 24 U/L (ref 0–44)
AST: 24 U/L (ref 15–41)
Albumin: 3.8 g/dL (ref 3.5–5.0)
Alkaline Phosphatase: 76 U/L (ref 38–126)
Bilirubin, Direct: <0.1 mg/dL (ref 0.0–0.2)
Total Bilirubin: 0.5 mg/dL (ref 0.3–1.2)
Total Protein: 7.5 g/dL (ref 6.5–8.1)

## 2020-03-05 LAB — TROPONIN I (HIGH SENSITIVITY): Troponin I (High Sensitivity): 4 ng/L (ref <18)

## 2020-03-05 MED ORDER — ALUM & MAG HYDROXIDE-SIMETH 200-200-20 MG/5ML PO SUSP
30.0000 mL | Freq: Once | ORAL | Status: AC
  Administered 2020-03-05: 30 mL via ORAL
  Filled 2020-03-05: qty 30

## 2020-03-05 MED ORDER — LIDOCAINE VISCOUS HCL 2 % MT SOLN
15.0000 mL | Freq: Once | OROMUCOSAL | Status: AC
  Administered 2020-03-05: 15 mL via ORAL
  Filled 2020-03-05: qty 15

## 2020-03-05 MED ORDER — PANTOPRAZOLE SODIUM 20 MG PO TBEC
20.0000 mg | DELAYED_RELEASE_TABLET | Freq: Once | ORAL | Status: AC
  Administered 2020-03-05: 20 mg via ORAL
  Filled 2020-03-05: qty 1

## 2020-03-05 NOTE — Discharge Instructions (Addendum)
You came to the emergency department today to be evaluated for your chest and abdominal pain.  Your EKG and lab work were reassuring that you are not having a heart attack.  I have given you information to follow-up with a cardiologist to have further work-up for your chest pain and a gastroenterologist to have further work-up for your abdominal pain.  Please continue to take your Pepcid and Prilosec as prescribed.    Get help right away if: Your chest pain gets worse. You have a cough that gets worse, or you cough up blood. You have severe pain in your abdomen. You faint. You have sudden, unexplained chest discomfort. You have sudden, unexplained discomfort in your arms, back, neck, or jaw. You have shortness of breath at any time. You suddenly start to sweat, or your skin gets clammy. You feel nausea or you vomit. You suddenly feel lightheaded or dizzy. You have severe weakness, or unexplained weakness or fatigue. Your heart begins to beat quickly, or it feels like it is skipping beats Your pain is only in areas of the abdomen, such as the right side or the left lower portion of the abdomen. Pain on the right side could be caused by appendicitis. You have bloody or black stools, or stools that look like tar. You have severe pain, cramping, or bloating in your abdomen. You have signs of dehydration, such as: Dark urine, very little urine, or no urine. Cracked lips. Dry mouth. Sunken eyes. Sleepiness. Weakness.

## 2020-03-05 NOTE — ED Provider Notes (Signed)
[facility] EMERGENCY DEPARTMENT Provider Note   CSN: [id number] Arrival date & time: 03/04/20  1900     History Chief Complaint  Patient presents with  . Chest Pain    Jeffrey Lamb is a 54 y.o. male a history of GERD on Prilosec and Pepcid.  Patient presented to the emergency department yesterday with a chief complaint of abdominal pain, chest pain and neck pain.  Patient reports that he has a burning sensation that runs from his neck all the way down to his stomach.  Patient reports that this burning sensation has been constant for the past 2 days, slightly worse with food and feels better when taking Prilosec and Pepcid.  Patient indicates that his pain is located in his left chest, right chest, bilateral shoulders, and around his umbilicus.  Patient states that whenever he has abdominal pain it is accompanied by chest pain.  Patient denies any pain with exertion, diaphoresis, shortness of breath, nausea, vomiting, cough, hematochezia, melena, back pain, dysuria or hematuria.  Patient reports that since his last emergency department visit for similar symptoms he has stopped eating any spicy foods and noticed a general improvement  Spanish interpreter was used for this interview.    HPI     History reviewed. No pertinent past medical history.  Patient Active Problem List   Diagnosis Date Noted  . Chest pain 08/12/2019  . Palpitations 08/12/2019    No past surgical history on file.     No family history on file.  Social History   Tobacco Use  . Smoking status: Never Smoker  . Smokeless tobacco: Never Used  Substance Use Topics  . Alcohol use: Never  . Drug use: Never    Home Medications Prior to Admission medications   Medication Sig Start Date End Date Taking? Authorizing Provider  acetaminophen (TYLENOL) 325 MG tablet Take 650 mg by mouth every 6 (six) hours as needed for mild pain or moderate pain.    [provider]   famotidine (PEPCID) 20 MG tablet Take 1 tablet (20 mg total) by mouth 2 (two) times daily. 01/09/20   Wieters, Hallie C, PA-C  naproxen (NAPROSYN) 500 MG tablet Take 1 tablet (500 mg total) by mouth 2 (two) times daily. 01/09/20   Wieters, Hallie C, PA-C  nitroGLYCERIN (NITROSTAT) 0.4 MG SL tablet Place 0.4 mg under the tongue See admin instructions. Place 1 tablet (0.4mg ) under the tongue every 5 minutes x3 as needed to relieve chest pain    [provider]  omeprazole (PRILOSEC) 20 MG capsule Take 1 capsule (20 mg total) by mouth 2 (two) times daily before a meal. 01/09/20   Wieters, Hallie C, PA-C    Allergies    Patient has no known allergies.  Review of Systems   Review of Systems  Constitutional: Negative for appetite change, chills and fever.  HENT: Negative.   Eyes: Negative for visual disturbance.  Respiratory: Negative for cough and shortness of breath.   Cardiovascular: Positive for chest pain. Negative for palpitations and leg swelling.  Gastrointestinal: Positive for abdominal pain. Negative for abdominal distention, blood in stool, constipation, nausea and vomiting.  Genitourinary: Negative for difficulty urinating, dysuria, frequency and hematuria.  Musculoskeletal: Negative for back pain and neck pain.  Skin: Negative for color change and rash.  Neurological: Negative for dizziness, syncope, light-headedness and headaches.  Psychiatric/Behavioral: Negative for confusion.    Physical Exam Updated Vital Signs BP 135/78   Pulse (!) 54   Temp 97.9  F (36.6 C)   Resp 14   SpO2 96%   Physical Exam Constitutional:      General: He is not in acute distress.    Appearance: He is not ill-appearing, toxic-appearing or diaphoretic.  HENT:     Head: Normocephalic.  Cardiovascular:     Rate and Rhythm: Regular rhythm. Bradycardia present.     Heart sounds: Normal heart sounds.  Pulmonary:     Effort: Pulmonary effort is normal.     Breath sounds: Normal breath  sounds.  Abdominal:     General: Bowel sounds are normal. There is no distension.     Palpations: Abdomen is soft. There is no mass or pulsatile mass.     Tenderness: There is no abdominal tenderness. There is no guarding or rebound. Negative signs include McBurney's sign.     Hernia: No hernia is present.  Musculoskeletal:     Cervical back: Neck supple.  Skin:    General: Skin is warm and dry.  Neurological:     General: No focal deficit present.     Mental Status: He is alert.  Psychiatric:        Behavior: Behavior is cooperative.     ED Results / Procedures / Treatments   Labs (all labs ordered are listed, but only abnormal results are displayed) Labs Reviewed  BASIC METABOLIC PANEL - Abnormal; Notable for the following components:      Result Value   Glucose, Bld 102 (*)    Calcium 8.7 (*)    All other components within normal limits  CBC - Abnormal; Notable for the following components:   WBC 11.0 (*)    All other components within normal limits  LIPASE, BLOOD  HEPATIC FUNCTION PANEL  TROPONIN I (HIGH SENSITIVITY)  TROPONIN I (HIGH SENSITIVITY)    EKG EKG Interpretation  Date/Time:  Monday March 04 2020 19:28:48 EST Ventricular Rate:  60 PR Interval:  180 QRS Duration: 74 QT Interval:  414 QTC Calculation: 414 R Axis:   38 Text Interpretation: Normal sinus rhythm Normal ECG since last tracing no significant change Confirmed by Eber Hong ([id number]) on 03/05/2020 11:55:21 AM   Radiology DG Chest 2 View  Result Date: 03/04/2020 CLINICAL DATA:  Chest pain for a few days radiating into the neck. EXAM: CHEST - 2 VIEW COMPARISON:  Radiographs 01/10/2020. FINDINGS: The heart size and mediastinal contours are normal. The lungs are clear. There is no pleural effusion or pneumothorax. No acute osseous findings are identified. Stable degenerative changes within the thoracic spine. IMPRESSION: No active cardiopulmonary process. Electronically Signed   By: Carey Bullocks M.D.   On: 03/04/2020 19:47   US Abdomen Limited  Result Date: 03/05/2020 CLINICAL DATA:  Abdominal pain.  Chest pain for a few days. EXAM: ULTRASOUND ABDOMEN LIMITED RIGHT UPPER QUADRANT COMPARISON:  None. FINDINGS: Gallbladder: No gallstones or wall thickening visualized. No sonographic Murphy sign noted by sonographer. Common bile duct: Diameter: 5 mm Liver: No focal lesion identified. Within normal limits in parenchymal echogenicity. Portal vein is patent on color Doppler imaging with normal direction of blood flow towards the liver. Other: None. IMPRESSION: Negative right upper quadrant ultrasound. Electronically Signed   By: Sebastian Ache M.D.   On: 03/05/2020 14:28    Procedures Procedures (including critical care time)  Medications Ordered in ED Medications  alum & mag hydroxide-simeth (MAALOX/MYLANTA) 200-200-20 MG/5ML suspension 30 mL (30 mLs Oral Given 03/05/20 1152)    And  lidocaine (XYLOCAINE) 2 % viscous mouth  solution 15 mL (15 mLs Oral Given 03/05/20 1152)  pantoprazole (PROTONIX) EC tablet 20 mg (20 mg Oral Given 03/05/20 1152)    ED Course  I have reviewed the triage vital signs and the nursing notes.  Pertinent labs & imaging results that were available during my care of the patient were reviewed by me and considered in my medical decision making (see chart for details).    MDM Rules/Calculators/A&P         HEART Score: 1                  Heart score is 1, troponin flat at 4, EKG showed normal sinus rhythm, chest x-ray shows no active cardiopulmonary process.  Less concerning for ACS.  Patient symptoms appear more related to GERD or peptic ulcer disease.  Patient was given GI cocktail and Protonix in the emergency department.  Patient reports feeling better.  Lipase, hepatic function and abdominal ultrasound were ordered and found to be unremarkable.    Patient is bradycardic, not on any rate controlling medications.  Per chart review bradycardia was noted at his  last emergency department visit as well as his last visit with cardiology.  Will have patient follow-up with GI and cardiology.  Patient was advised to continue using his prescribed omeprazole and Pepcid.  Patient given strict return precautions.  Patient expressed understanding of all instructions.    Final Clinical Impression(s) / ED Diagnoses Final diagnoses:  Precordial chest pain  Generalized abdominal pain    Rx / DC Orders ED Discharge Orders    None       Berneice Heinrich 03/05/20 1811    Eber Hong, MD 03/06/20 [phone]

## 2020-03-05 NOTE — ED Provider Notes (Signed)
CP constant for 2 days - worse wth eating - goes frm neck to upper abd - no n/v - EKG normal, trop neg, treated for GERD, needs formal referral to cardiology.  GI w/u neg with [place] and LFT's as well  Medical screening examination/treatment/procedure(s) were conducted as a shared visit with non-physician practitioner(s) and myself.  I personally evaluated the patient during the encounter.  Clinical Impression:   Final diagnoses:  Precordial chest pain  Generalized abdominal pain         Eber Hong, MD 03/06/20 [phone]

## 2020-04-17 ENCOUNTER — Ambulatory Visit (HOSPITAL_COMMUNITY)
Admission: EM | Admit: 2020-04-17 | Discharge: 2020-04-17 | Disposition: A | Discharge status: Home / Self Care | Payer: Self-pay | Attending: Family Medicine | Admitting: Family Medicine

## 2020-04-17 ENCOUNTER — Encounter (HOSPITAL_COMMUNITY): Payer: Self-pay

## 2020-04-17 DIAGNOSIS — K2950 Unspecified chronic gastritis without bleeding: Secondary | ICD-10-CM

## 2020-04-17 MED ORDER — ALUM & MAG HYDROXIDE-SIMETH 200-200-20 MG/5ML PO SUSP
30.0000 mL | Freq: Once | ORAL | Status: AC
  Administered 2020-04-17: 30 mL via ORAL

## 2020-04-17 MED ORDER — ALUM & MAG HYDROXIDE-SIMETH 200-200-20 MG/5ML PO SUSP
ORAL | Status: AC
  Filled 2020-04-17: qty 30

## 2020-04-17 MED ORDER — SUCRALFATE 1 G PO TABS: 1.0000 g | ORAL_TABLET | Freq: Three times a day (TID) | ORAL | 1 refills | Status: AC

## 2020-04-17 MED ORDER — PANTOPRAZOLE SODIUM 40 MG PO TBEC: 40.0000 mg | DELAYED_RELEASE_TABLET | Freq: Two times a day (BID) | ORAL | 1 refills | Status: AC

## 2020-04-17 MED ORDER — LIDOCAINE VISCOUS HCL 2 % MT SOLN
15.0000 mL | Freq: Once | OROMUCOSAL | Status: AC
  Administered 2020-04-17: 15 mL via ORAL

## 2020-04-17 MED ORDER — LIDOCAINE VISCOUS HCL 2 % MT SOLN
OROMUCOSAL | Status: AC
  Filled 2020-04-17: qty 15

## 2020-04-17 NOTE — ED Triage Notes (Signed)
Pt c/o lower and mid abdominal pain that radiate to his chest. He states the pain sometimes causes him to be SOB. Pt states he has been to the ED to get an X-ray. He states the X-ray resulted normal, but he feels he is not okay. Pt states he feels tightness in his abdomen area when inhaling. He states when he eats the pain gets worse.

## 2020-04-21 NOTE — ED Provider Notes (Signed)
[facility]    CSN: [id number] Arrival date & time: 04/17/20  1803      History   Chief Complaint Chief Complaint  Patient presents with  . Abdominal Pain  . Shortness of Breath    HPI Jeffrey Lamb is a 55 y.o. male.   Here today with mid and upper abdominal pain that he states radiates up through chest to back of throat. Sometimes becomes SOB with this pain but overall no other associated sxs including diaphoresis, N/V, fever, palpitations, dizziness, syncope, diarrhea. Eating seems to make the issue worse. Has been seen in the ED for this several times with full workup - neg chest x-rays, neg troponins and EKGs, benign appearing labs overall. GI cocktails have always resolved the issue in the past. He currently takes OTC prilosec and pepcid which seems to help some.      History reviewed. No pertinent past medical history.  Patient Active Problem List   Diagnosis Date Noted  . Chest pain 08/12/2019  . Palpitations 08/12/2019    History reviewed. No pertinent surgical history.     Home Medications    Prior to Admission medications   Medication Sig Start Date End Date Taking? Authorizing Provider  pantoprazole (PROTONIX) 40 MG tablet Take 1 tablet (40 mg total) by mouth 2 (two) times daily before a meal. 04/17/20  Yes Particia Nearing, PA-C  sucralfate (CARAFATE) 1 g tablet Take 1 tablet (1 g total) by mouth 4 (four) times daily -  with meals and at bedtime. 04/17/20  Yes Particia Nearing, PA-C  acetaminophen (TYLENOL) 325 MG tablet Take 650 mg by mouth every 6 (six) hours as needed for mild pain or moderate pain.    [provider]  famotidine (PEPCID) 20 MG tablet Take 1 tablet (20 mg total) by mouth 2 (two) times daily. 01/09/20   Wieters, Hallie C, PA-C  naproxen (NAPROSYN) 500 MG tablet Take 1 tablet (500 mg total) by mouth 2 (two) times daily. 01/09/20   Wieters, Hallie C, PA-C  nitroGLYCERIN (NITROSTAT) 0.4 MG SL tablet  Place 0.4 mg under the tongue See admin instructions. Place 1 tablet (0.4mg ) under the tongue every 5 minutes x3 as needed to relieve chest pain    [provider]  omeprazole (PRILOSEC) 20 MG capsule Take 1 capsule (20 mg total) by mouth 2 (two) times daily before a meal. 01/09/20   Wieters, Junius Creamer, PA-C    Family History History reviewed. No pertinent family history.  Social History Social History   Tobacco Use  . Smoking status: Never Smoker  . Smokeless tobacco: Never Used  Substance Use Topics  . Alcohol use: Never  . Drug use: Never     Allergies   Patient has no known allergies.   Review of Systems Review of Systems PER HPI    Physical Exam Triage Vital Signs ED Triage Vitals  Enc Vitals Group     BP 04/17/20 1911 137/72     Pulse Rate 04/17/20 1911 61     Resp 04/17/20 1911 15     Temp 04/17/20 1911 98.2 F (36.8 C)     Temp Source 04/17/20 1911 Oral     SpO2 04/17/20 1911 97 %     Weight --      Height --      Head Circumference --      Peak Flow --      Pain Score 04/17/20 1909 5     Pain Loc --  Pain Edu? --      Excl. in GC? --    No data found.  Updated Vital Signs BP 137/72 (BP Location: Right Arm)   Pulse 61   Temp 98.2 F (36.8 C) (Oral)   Resp 15   SpO2 97%   Visual Acuity Right Eye Distance:   Left Eye Distance:   Bilateral Distance:    Right Eye Near:   Left Eye Near:    Bilateral Near:     Physical Exam Vitals and nursing note reviewed.  Constitutional:      Appearance: Normal appearance. He is well-developed.  HENT:     Head: Atraumatic.  Eyes:     Extraocular Movements: Extraocular movements intact.     Conjunctiva/sclera: Conjunctivae normal.  Cardiovascular:     Rate and Rhythm: Normal rate and regular rhythm.     Heart sounds: Normal heart sounds.  Pulmonary:     Effort: Pulmonary effort is normal.     Breath sounds: Normal breath sounds.  Abdominal:     General: Bowel sounds are normal. There  is no distension.     Palpations: Abdomen is soft. There is no mass.     Tenderness: There is abdominal tenderness (epigastric ttp). There is no right CVA tenderness, left CVA tenderness or guarding.     Hernia: No hernia is present.  Musculoskeletal:        General: Normal range of motion.     Cervical back: Normal range of motion and neck supple.  Skin:    General: Skin is warm and dry.  Neurological:     General: No focal deficit present.     Mental Status: He is alert and oriented to person, place, and time.  Psychiatric:        Mood and Affect: Mood normal.        Thought Content: Thought content normal.        Judgment: Judgment normal.      UC Treatments / Results  Labs (all labs ordered are listed, but only abnormal results are displayed) Labs Reviewed - No data to display  EKG   Radiology No results found.  Procedures Procedures (including critical care time)  Medications Ordered in UC Medications  alum & mag hydroxide-simeth (MAALOX/MYLANTA) 200-200-20 MG/5ML suspension 30 mL (30 mLs Oral Given 04/17/20 1944)    And  lidocaine (XYLOCAINE) 2 % viscous mouth solution 15 mL (15 mLs Oral Given 04/17/20 1944)    Initial Impression / Assessment and Plan / UC Course  I have reviewed the triage vital signs and the nursing notes.  Pertinent labs & imaging results that were available during my care of the patient were reviewed by me and considered in my medical decision making (see chart for details).     Vitals, exam very reassuring today. He declines repeat EKG, CXR as it was just normal last month in ED with same sxs. He is requesting GI cocktail in clinic which was beneficial for patient in previous episodes. This was given and will start high dose protonix, carafate and continue bland diet. Strongly recommended GI specialist consultation given severity and persistence of issue despite consistent PPI use to r/o PUD, H pylori, etc. Information given for this. Strict  return precautions reviewed.   Final Clinical Impressions(s) / UC Diagnoses   Final diagnoses:  Chronic gastritis, presence of bleeding unspecified, unspecified gastritis type   Discharge Instructions   None    ED Prescriptions    Medication Sig Dispense Auth.  Provider   pantoprazole (PROTONIX) 40 MG tablet Take 1 tablet (40 mg total) by mouth 2 (two) times daily before a meal. 60 tablet Particia Nearing, PA-C   sucralfate (CARAFATE) 1 g tablet Take 1 tablet (1 g total) by mouth 4 (four) times daily -  with meals and at bedtime. 120 tablet Particia Nearing, [place]     PDMP not reviewed this encounter.   Particia Nearing, [place] 04/21/20 1118

## 2021-09-17 IMAGING — DX DG CHEST 2V
2 series · 2 of 2 positions shown · non-contrast
Comparison: Radiographs 01/10/2020.

CLINICAL DATA: Chest pain for a few days radiating into the neck.

EXAM:
CHEST - 2 VIEW

[chest pa]
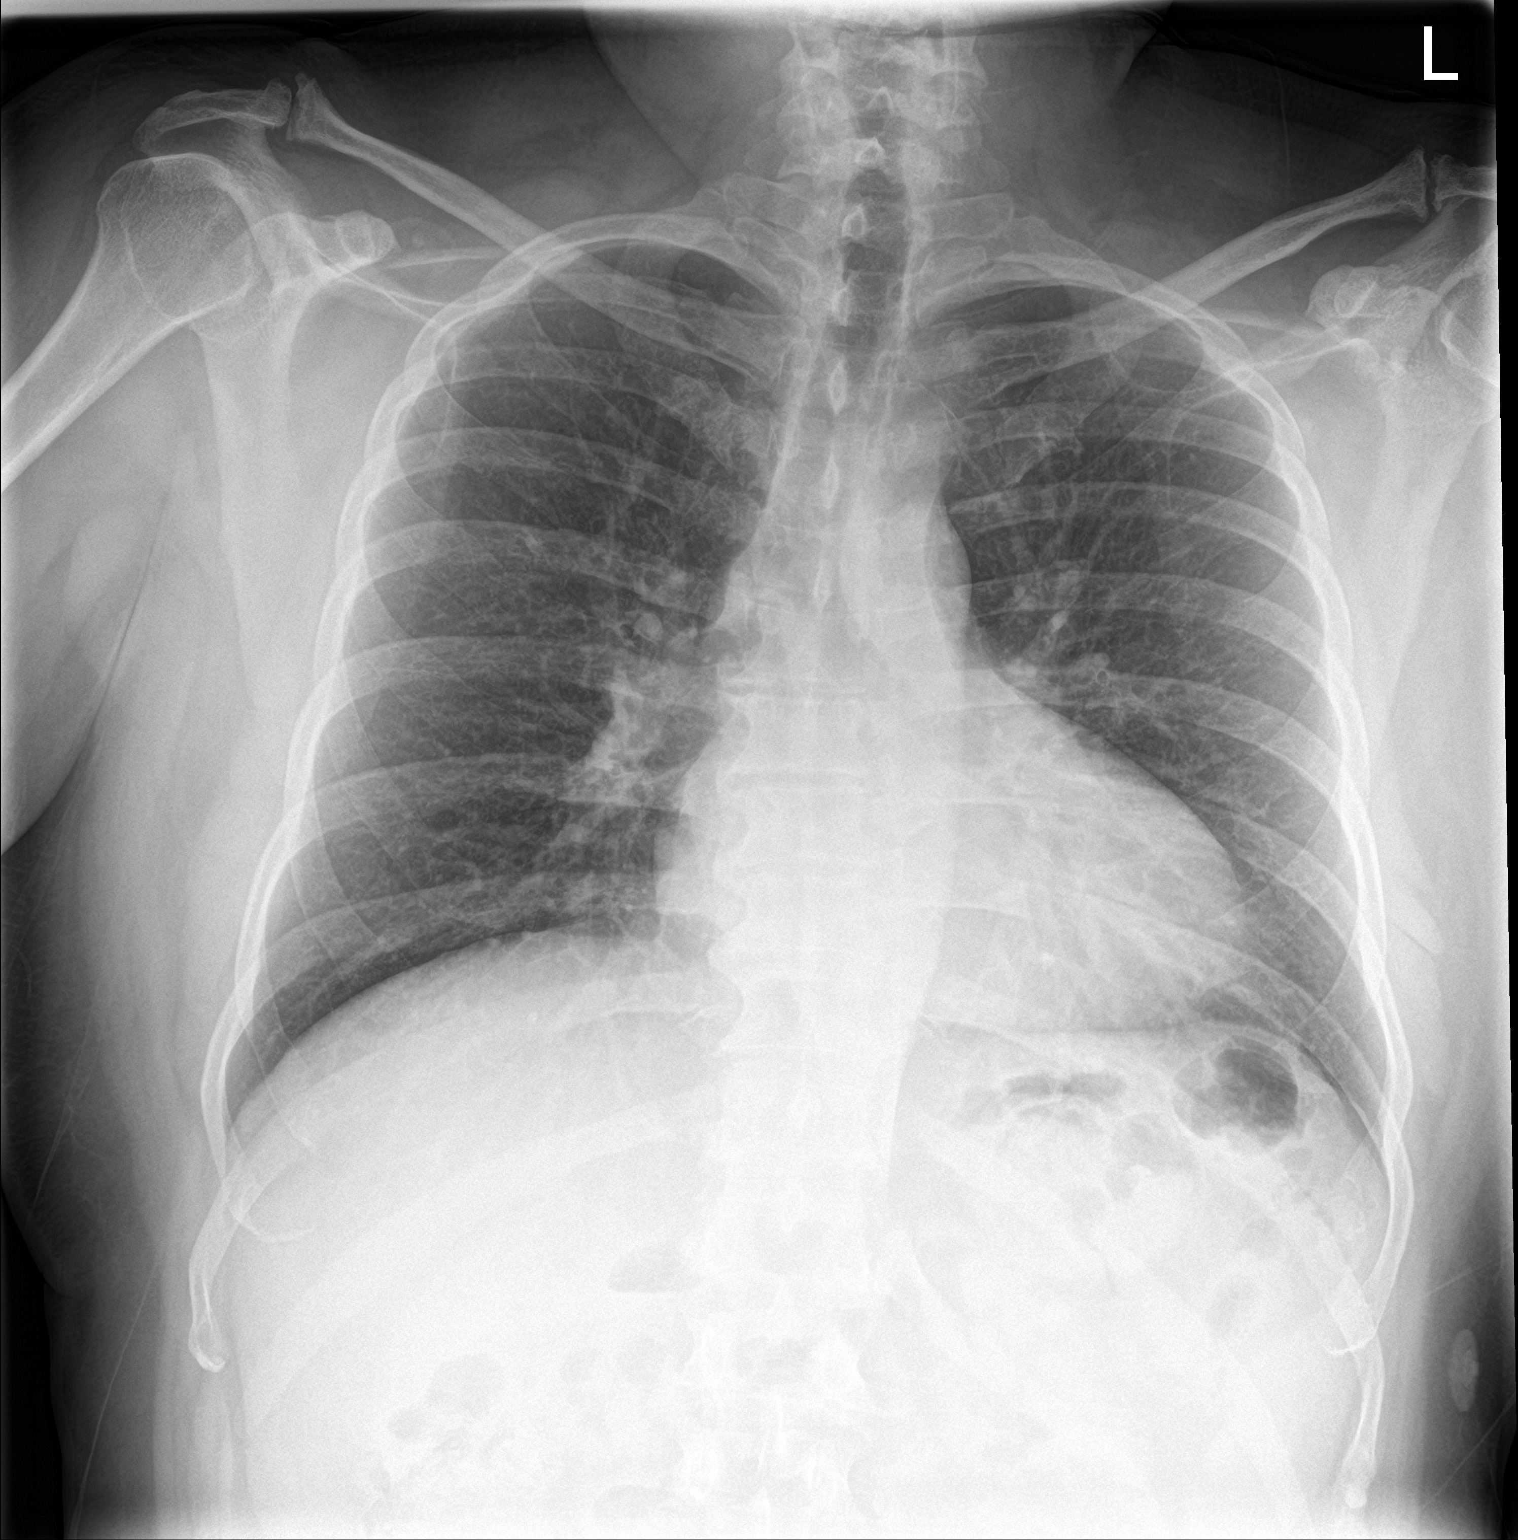

[chest lat]
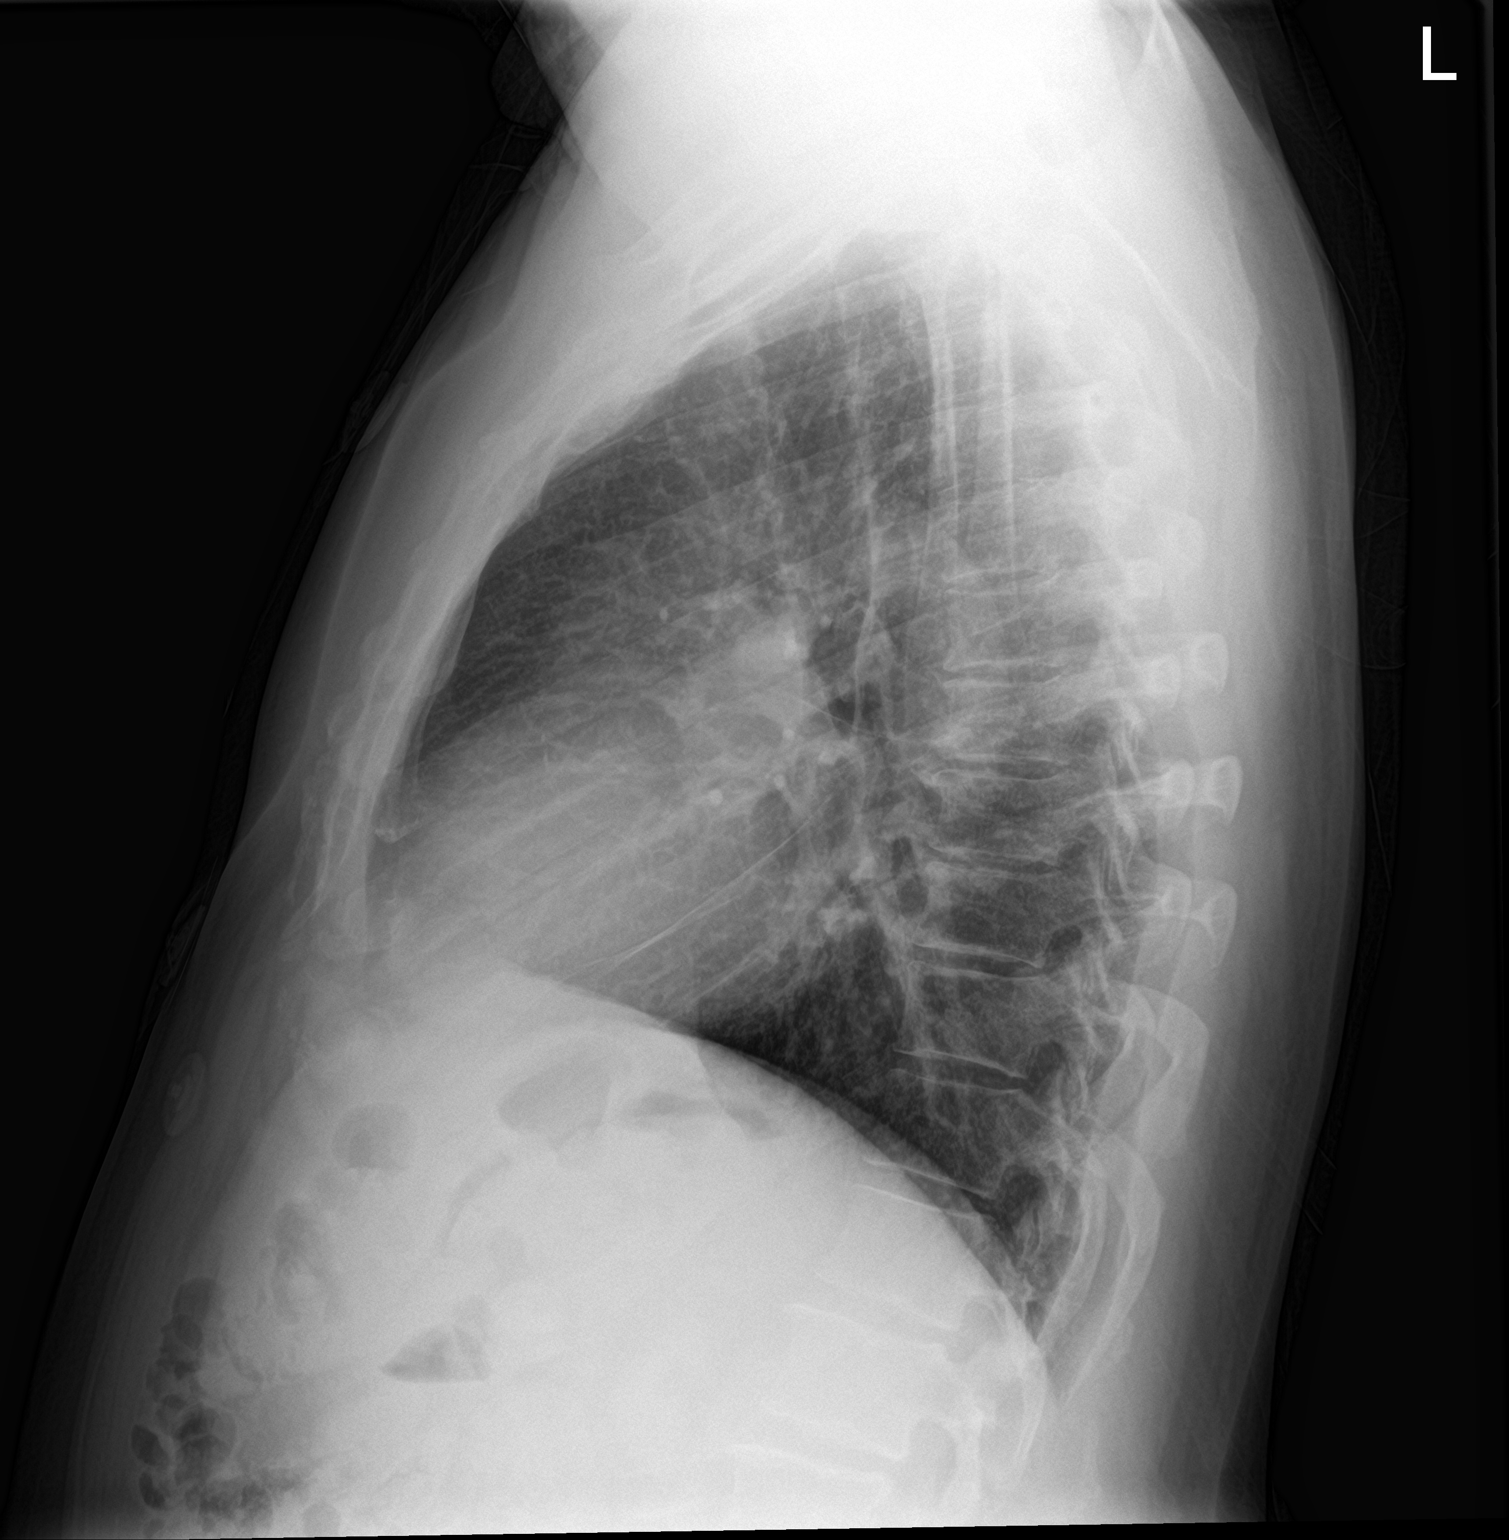

[2 of 2 positions shown; findings below may reference images not displayed]

FINDINGS: The heart size and mediastinal contours are normal. The lungs are
clear. There is no pleural effusion or pneumothorax. No acute
osseous findings are identified. Stable degenerative changes within
the thoracic spine.
IMPRESSION: No active cardiopulmonary process.

## 2024-01-05 ENCOUNTER — Ambulatory Visit (HOSPITAL_COMMUNITY)
Admission: EM | Admit: 2024-01-05 | Discharge: 2024-01-05 | Disposition: A | Discharge status: Home / Self Care | Payer: Self-pay | Source: Intra-hospital | Attending: Physician Assistant | Admitting: Physician Assistant

## 2024-01-05 ENCOUNTER — Encounter (HOSPITAL_COMMUNITY): Payer: Self-pay

## 2024-01-05 DIAGNOSIS — N451 Epididymitis: Secondary | ICD-10-CM

## 2024-01-05 DIAGNOSIS — R300 Dysuria: Secondary | ICD-10-CM

## 2024-01-05 LAB — POCT URINALYSIS DIP (MANUAL ENTRY)
Bilirubin, UA: NEGATIVE
Blood, UA: NEGATIVE
Glucose, UA: NEGATIVE mg/dL
Ketones, POC UA: NEGATIVE mg/dL
Leukocytes, UA: NEGATIVE
Nitrite, UA: NEGATIVE
Protein Ur, POC: NEGATIVE mg/dL
Spec Grav, UA: 1.025 (ref 1.010–1.025)
Urobilinogen, UA: 0.2 U/dL
pH, UA: 5 (ref 5.0–8.0)

## 2024-01-05 MED ORDER — DOXYCYCLINE HYCLATE 100 MG PO CAPS: 100.0000 mg | ORAL_CAPSULE | Freq: Two times a day (BID) | ORAL | 0 refills | Status: AC

## 2024-01-05 NOTE — Discharge Instructions (Addendum)
 Follow up with urology for recheck.  Return if any problems.

## 2024-01-05 NOTE — ED Triage Notes (Signed)
 Lower abdominal pain x 2 weeks. Patient reports Dysuria x 2 weeks. Denies any vomiting and diarrhea.

## 2024-01-05 NOTE — ED Provider Notes (Signed)
 [facility]    CSN: [id number] Arrival date & time: 01/05/24  1755      History   Chief Complaint Chief Complaint  Patient presents with   Abdominal Pain    HPI Jeffrey Lamb is a 58 y.o. male.   Patient complains of discomfort to his left testicle and lower abdomen for the past 2 weeks.  Patient is concerned that he has infection.  Patient reports he has had frequent urination and has continuous urge to urinate.  Patient denies any past medical history of diabetes.  Patient denies any concerns about sexually transmitted disease.  Patient denies any fever or chills he has not had any vomiting he denies any diarrhea.  Patient reports he has not felt any knots or any masses to his testicles or scrotum.  The history is provided by the patient. A language interpreter was used.  Abdominal Pain   History reviewed. No pertinent past medical history.  Patient Active Problem List   Diagnosis Date Noted   Chest pain 08/12/2019   Palpitations 08/12/2019    History reviewed. No pertinent surgical history.     Home Medications    Prior to Admission medications   Medication Sig Start Date End Date Taking? Authorizing Provider  acetaminophen (TYLENOL) 325 MG tablet Take 650 mg by mouth every 6 (six) hours as needed for mild pain or moderate pain.    [provider]  famotidine  (PEPCID ) 20 MG tablet Take 1 tablet (20 mg total) by mouth 2 (two) times daily. 01/09/20   Wieters, Hallie C, PA-C  naproxen  (NAPROSYN ) 500 MG tablet Take 1 tablet (500 mg total) by mouth 2 (two) times daily. 01/09/20   Wieters, Hallie C, PA-C  nitroGLYCERIN (NITROSTAT) 0.4 MG SL tablet Place 0.4 mg under the tongue See admin instructions. Place 1 tablet (0.4mg ) under the tongue every 5 minutes x3 as needed to relieve chest pain    [provider]  omeprazole  (PRILOSEC) 20 MG capsule Take 1 capsule (20 mg total) by mouth 2 (two) times daily before a meal. 01/09/20    Wieters, Hallie C, PA-C  pantoprazole  (PROTONIX ) 40 MG tablet Take 1 tablet (40 mg total) by mouth 2 (two) times daily before a meal. 04/17/20   Stuart Vernell Norris, PA-C  sucralfate  (CARAFATE ) 1 g tablet Take 1 tablet (1 g total) by mouth 4 (four) times daily -  with meals and at bedtime. 04/17/20   Stuart Vernell Norris, PA-C    Family History History reviewed. No pertinent family history.  Social History Social History   Tobacco Use   Smoking status: Never   Smokeless tobacco: Never  Substance Use Topics   Alcohol use: Never   Drug use: Never     Allergies   Patient has no known allergies.   Review of Systems Review of Systems  Gastrointestinal:  Positive for abdominal pain.  All other systems reviewed and are negative.    Physical Exam Triage Vital Signs ED Triage Vitals  Encounter Vitals Group     BP 01/05/24 1815 (!) 143/86     Girls Systolic BP Percentile --      Girls Diastolic BP Percentile --      Boys Systolic BP Percentile --      Boys Diastolic BP Percentile --      Pulse Rate 01/05/24 1815 60     Resp 01/05/24 1815 18     Temp 01/05/24 1815 98.4 F (36.9 C)     Temp Source 01/05/24  1815 Oral     SpO2 01/05/24 1815 95 %     Weight --      Height --      Head Circumference --      Peak Flow --      Pain Score 01/05/24 1817 7     Pain Loc --      Pain Education --      Exclude from Growth Chart --    No data found.  Updated Vital Signs BP (!) 143/86 (BP Location: Left Arm)   Pulse 60   Temp 98.4 F (36.9 C) (Oral)   Resp 18   SpO2 95%   Visual Acuity Right Eye Distance:   Left Eye Distance:   Bilateral Distance:    Right Eye Near:   Left Eye Near:    Bilateral Near:     Physical Exam Vitals and nursing note reviewed.  Constitutional:      Appearance: He is well-developed.  HENT:     Head: Normocephalic.  Cardiovascular:     Rate and Rhythm: Normal rate.  Pulmonary:     Effort: Pulmonary effort is normal.  Abdominal:      General: There is no distension.  Genitourinary:    Testes: Normal.        Right: Tenderness or swelling not present.        Left: Tenderness or swelling not present.  Musculoskeletal:        General: Normal range of motion.     Cervical back: Normal range of motion.  Skin:    General: Skin is warm.  Neurological:     General: No focal deficit present.     Mental Status: He is alert and oriented to person, place, and time.      UC Treatments / Results  Labs (all labs ordered are listed, but only abnormal results are displayed) Labs Reviewed  POCT URINALYSIS DIP (MANUAL ENTRY)    EKG   Radiology No results found.  Procedures Procedures (including critical care time)  Medications Ordered in UC Medications - No data to display  Initial Impression / Assessment and Plan / UC Course  I have reviewed the triage vital signs and the nursing notes.  Pertinent labs & imaging results that were available during my care of the patient were reviewed by me and considered in my medical decision making (see chart for details).     UA no sign of infection, patient denies STD risk.  Patient may have epididymitis.  Patient is given a prescription for doxycycline.  He is advised if symptoms worsen or change she should go to the emergency department for further evaluation.  Patient is given the phone number for urology to follow-up with. Final Clinical Impressions(s) / UC Diagnoses   Final diagnoses:  Dysuria     Discharge Instructions      Follow up with urology for recheck.  Return if any problems.     ED Prescriptions     Medication Sig Dispense Auth. Provider   doxycycline (VIBRAMYCIN) 100 MG capsule Take 1 capsule (100 mg total) by mouth 2 (two) times daily. 20 capsule Delena Casebeer K, PA-C     An After Visit Summary was printed and given to the patient.     PDMP not reviewed this encounter.   Flint Sonny POUR, PA-C 01/05/24 1859

## 2024-01-05 NOTE — ED Triage Notes (Signed)
 Also, patient states he has been in left testicle.
